# Patient Record
Sex: Male | Born: 1935 | Race: White | Hispanic: No | Marital: Married
Health system: Southern US, Community
[De-identification: ages and names within clinical notes are randomized; demographics above are authoritative.]

## PROBLEM LIST (undated history)

## (undated) DIAGNOSIS — C801 Malignant (primary) neoplasm, unspecified: Secondary | ICD-10-CM

## (undated) HISTORY — PX: HERNIA REPAIR: SHX51

## (undated) HISTORY — PX: EYE SURGERY: SHX253

---

## 2005-03-24 ENCOUNTER — Emergency Department: Payer: Self-pay | Admitting: Emergency Medicine

## 2005-03-24 ENCOUNTER — Other Ambulatory Visit: Payer: Self-pay

## 2006-02-13 ENCOUNTER — Ambulatory Visit: Payer: Self-pay | Admitting: Psychiatry

## 2006-02-13 ENCOUNTER — Other Ambulatory Visit: Payer: Self-pay

## 2006-12-21 ENCOUNTER — Emergency Department: Payer: Self-pay

## 2012-03-26 ENCOUNTER — Ambulatory Visit: Payer: Self-pay | Admitting: Urology

## 2012-04-04 ENCOUNTER — Ambulatory Visit: Payer: Self-pay | Admitting: Urology

## 2012-04-08 ENCOUNTER — Ambulatory Visit: Payer: Self-pay | Admitting: Urology

## 2012-05-30 ENCOUNTER — Ambulatory Visit: Payer: Self-pay | Admitting: Unknown Physician Specialty

## 2012-05-30 LAB — APTT: Activated PTT: 29.8 secs (ref 23.6–35.9)

## 2012-08-20 ENCOUNTER — Ambulatory Visit: Payer: Self-pay | Admitting: Unknown Physician Specialty

## 2012-08-22 ENCOUNTER — Ambulatory Visit: Payer: Self-pay | Admitting: Oncology

## 2012-08-22 LAB — CBC CANCER CENTER
Eosinophil #: 0.1 x10 3/mm (ref 0.0–0.7)
Eosinophil %: 2.5 %
HGB: 11.7 g/dL — ABNORMAL LOW (ref 13.0–18.0)
Lymphocyte #: 0.7 x10 3/mm — ABNORMAL LOW (ref 1.0–3.6)
Lymphocyte %: 13.3 %
MCH: 28.7 pg (ref 26.0–34.0)
MCV: 87 fL (ref 80–100)
Monocyte #: 0.5 x10 3/mm (ref 0.2–1.0)
Monocyte %: 9.8 %
RDW: 15.3 % — ABNORMAL HIGH (ref 11.5–14.5)
WBC: 5.4 x10 3/mm (ref 3.8–10.6)

## 2012-08-22 LAB — COMPREHENSIVE METABOLIC PANEL
Albumin: 2.7 g/dL — ABNORMAL LOW (ref 3.4–5.0)
Alkaline Phosphatase: 77 U/L (ref 50–136)
Anion Gap: 6 — ABNORMAL LOW (ref 7–16)
BUN: 18 mg/dL (ref 7–18)
Chloride: 100 mmol/L (ref 98–107)
Co2: 30 mmol/L (ref 21–32)
EGFR (African American): >60
EGFR (Non-African Amer.): >60
Osmolality: 274 (ref 275–301)
Potassium: 3.8 mmol/L (ref 3.5–5.1)
SGPT (ALT): 13 U/L (ref 12–78)
Sodium: 136 mmol/L (ref 136–145)
Total Protein: 7.3 g/dL (ref 6.4–8.2)

## 2012-08-23 LAB — CANCER ANTIGEN 19-9: CA 19-9: 10 U/mL (ref 0–35)

## 2012-08-28 LAB — CBC CANCER CENTER
Basophil %: 1 %
Eosinophil #: 0.1 x10 3/mm (ref 0.0–0.7)
Eosinophil %: 2.5 %
HCT: 36.1 % — ABNORMAL LOW (ref 40.0–52.0)
HGB: 11.8 g/dL — ABNORMAL LOW (ref 13.0–18.0)
Lymphocyte #: 0.8 x10 3/mm — ABNORMAL LOW (ref 1.0–3.6)
MCH: 28.3 pg (ref 26.0–34.0)
MCHC: 32.7 g/dL (ref 32.0–36.0)
MCV: 87 fL (ref 80–100)
Monocyte #: 0.5 x10 3/mm (ref 0.2–1.0)
Monocyte %: 9.5 %
Platelet: 285 x10 3/mm (ref 150–440)
RBC: 4.17 10*6/uL — ABNORMAL LOW (ref 4.40–5.90)
RDW: 15.9 % — ABNORMAL HIGH (ref 11.5–14.5)

## 2012-08-28 LAB — APTT: Activated PTT: 28.1 secs (ref 23.6–35.9)

## 2012-09-11 ENCOUNTER — Encounter (HOSPITAL_COMMUNITY): Payer: Self-pay | Admitting: Pharmacy Technician

## 2012-09-11 ENCOUNTER — Other Ambulatory Visit: Payer: Self-pay

## 2012-09-11 ENCOUNTER — Telehealth: Payer: Self-pay

## 2012-09-11 ENCOUNTER — Encounter (HOSPITAL_COMMUNITY): Payer: Self-pay | Admitting: *Deleted

## 2012-09-11 DIAGNOSIS — R1900 Intra-abdominal and pelvic swelling, mass and lump, unspecified site: Secondary | ICD-10-CM

## 2012-09-11 NOTE — Telephone Encounter (Signed)
Pt has been notified and instructed instructions have also been mailed to the home meds were also reviewed

## 2012-09-16 ENCOUNTER — Inpatient Hospital Stay: Payer: Self-pay | Admitting: Student

## 2012-09-16 LAB — CBC
HCT: 34.2 % — ABNORMAL LOW (ref 40.0–52.0)
HGB: 11.4 g/dL — ABNORMAL LOW (ref 13.0–18.0)
MCH: 29 pg (ref 26.0–34.0)
MCHC: 33.2 g/dL (ref 32.0–36.0)
MCV: 87 fL (ref 80–100)
Platelet: 313 10*3/uL (ref 150–440)

## 2012-09-16 LAB — COMPREHENSIVE METABOLIC PANEL
Albumin: 2.7 g/dL — ABNORMAL LOW (ref 3.4–5.0)
Alkaline Phosphatase: 83 U/L (ref 50–136)
BUN: 48 mg/dL — ABNORMAL HIGH (ref 7–18)
Chloride: 102 mmol/L (ref 98–107)
Co2: 27 mmol/L (ref 21–32)
Creatinine: 2.6 mg/dL — ABNORMAL HIGH (ref 0.60–1.30)
EGFR (African American): 24 — ABNORMAL LOW
EGFR (Non-African Amer.): 21 — ABNORMAL LOW
Glucose: 110 mg/dL — ABNORMAL HIGH (ref 65–99)
Osmolality: 287 (ref 275–301)
Potassium: 4.7 mmol/L (ref 3.5–5.1)
SGOT(AST): 31 U/L (ref 15–37)
Total Protein: 7.4 g/dL (ref 6.4–8.2)

## 2012-09-16 LAB — URINALYSIS, COMPLETE
Glucose,UR: NEGATIVE mg/dL (ref 0–75)
Hyaline Cast: 1
Ketone: NEGATIVE
Nitrite: NEGATIVE
Ph: 6 (ref 4.5–8.0)
Protein: 75
RBC,UR: 376 /HPF (ref 0–5)
Specific Gravity: 1.025 (ref 1.003–1.030)

## 2012-09-16 LAB — CK TOTAL AND CKMB (NOT AT ARMC)
CK, Total: 84 U/L (ref 35–232)
CK-MB: <0.5 ng/mL — ABNORMAL LOW (ref 0.5–3.6)

## 2012-09-16 LAB — TROPONIN I: Troponin-I: <0.02 ng/mL

## 2012-09-17 LAB — CBC WITH DIFFERENTIAL/PLATELET
Basophil #: 0 10*3/uL (ref 0.0–0.1)
Basophil %: 0.3 %
Eosinophil #: 0.1 10*3/uL (ref 0.0–0.7)
Lymphocyte %: 5.1 %
MCH: 29.1 pg (ref 26.0–34.0)
MCHC: 33.9 g/dL (ref 32.0–36.0)
Monocyte #: 0.9 x10 3/mm (ref 0.2–1.0)
Neutrophil #: 10.3 10*3/uL — ABNORMAL HIGH (ref 1.4–6.5)
Neutrophil %: 86.4 %
WBC: 12 10*3/uL — ABNORMAL HIGH (ref 3.8–10.6)

## 2012-09-17 LAB — BASIC METABOLIC PANEL
Anion Gap: 7 (ref 7–16)
BUN: 50 mg/dL — ABNORMAL HIGH (ref 7–18)
Calcium, Total: 8.8 mg/dL (ref 8.5–10.1)
Co2: 27 mmol/L (ref 21–32)
Creatinine: 2.33 mg/dL — ABNORMAL HIGH (ref 0.60–1.30)
EGFR (Non-African Amer.): 24 — ABNORMAL LOW
Glucose: 104 mg/dL — ABNORMAL HIGH (ref 65–99)
Osmolality: 287 (ref 275–301)
Sodium: 137 mmol/L (ref 136–145)

## 2012-09-18 LAB — BASIC METABOLIC PANEL
Anion Gap: 10 (ref 7–16)
BUN: 45 mg/dL — ABNORMAL HIGH (ref 7–18)
Calcium, Total: 8.5 mg/dL (ref 8.5–10.1)
Chloride: 105 mmol/L (ref 98–107)
EGFR (African American): 52 — ABNORMAL LOW
EGFR (Non-African Amer.): 45 — ABNORMAL LOW
Glucose: 134 mg/dL — ABNORMAL HIGH (ref 65–99)
Potassium: 3.8 mmol/L (ref 3.5–5.1)
Sodium: 139 mmol/L (ref 136–145)

## 2012-09-19 ENCOUNTER — Ambulatory Visit: Payer: Self-pay | Admitting: Oncology

## 2012-09-22 LAB — CULTURE, BLOOD (SINGLE)

## 2012-09-26 ENCOUNTER — Encounter (HOSPITAL_COMMUNITY): Payer: Self-pay | Admitting: *Deleted

## 2012-09-26 ENCOUNTER — Ambulatory Visit (HOSPITAL_COMMUNITY): Admission: RE | Admit: 2012-09-26 | Payer: Medicare Other | Source: Ambulatory Visit | Admitting: Gastroenterology

## 2012-09-26 ENCOUNTER — Ambulatory Visit: Payer: Self-pay | Admitting: Oncology

## 2012-09-26 ENCOUNTER — Telehealth: Payer: Self-pay | Admitting: Gastroenterology

## 2012-09-26 HISTORY — DX: Malignant (primary) neoplasm, unspecified: C80.1

## 2012-09-26 SURGERY — UPPER ENDOSCOPIC ULTRASOUND (EUS) LINEAR
Anesthesia: Monitor Anesthesia Care

## 2012-09-26 NOTE — Telephone Encounter (Signed)
Left message on Dr Aleda Grana nurse line (phone [phone]) that patient no showed EUS today. Advised that we are more than happy to reschedule this is they are interested. I have also asked that someone call back to confirm that they have received our message.

## 2012-09-26 NOTE — Telephone Encounter (Signed)
Can you please let Dr. Doylene Canning [facility]) know that Mr. Ginsberg did not show for todays EUS.  We can reschedule if they are still interested.

## 2012-10-05 ENCOUNTER — Emergency Department: Payer: Self-pay | Admitting: Emergency Medicine

## 2012-10-05 LAB — COMPREHENSIVE METABOLIC PANEL
Albumin: 2.4 g/dL — ABNORMAL LOW (ref 3.4–5.0)
Alkaline Phosphatase: 82 U/L (ref 50–136)
Anion Gap: 7 (ref 7–16)
Calcium, Total: 9 mg/dL (ref 8.5–10.1)
Chloride: 98 mmol/L (ref 98–107)
Co2: 33 mmol/L — ABNORMAL HIGH (ref 21–32)
Creatinine: 2.15 mg/dL — ABNORMAL HIGH (ref 0.60–1.30)
EGFR (African American): 31 — ABNORMAL LOW
EGFR (Non-African Amer.): 27 — ABNORMAL LOW
Glucose: 109 mg/dL — ABNORMAL HIGH (ref 65–99)
Osmolality: 295 (ref 275–301)
Potassium: 3.5 mmol/L (ref 3.5–5.1)
SGOT(AST): 50 U/L — ABNORMAL HIGH (ref 15–37)
SGPT (ALT): 16 U/L (ref 12–78)
Sodium: 138 mmol/L (ref 136–145)
Total Protein: 7.4 g/dL (ref 6.4–8.2)

## 2012-10-05 LAB — URINALYSIS, COMPLETE
Bacteria: NONE SEEN
Bilirubin,UR: NEGATIVE
Glucose,UR: NEGATIVE mg/dL (ref 0–75)
RBC,UR: 281 /HPF (ref 0–5)
Specific Gravity: 1.01 (ref 1.003–1.030)
WBC UR: 9 /HPF (ref 0–5)

## 2012-10-05 LAB — CBC
HGB: 10.7 g/dL — ABNORMAL LOW (ref 13.0–18.0)
RDW: 16.4 % — ABNORMAL HIGH (ref 11.5–14.5)
WBC: 9.5 10*3/uL (ref 3.8–10.6)

## 2012-10-20 ENCOUNTER — Ambulatory Visit: Payer: Self-pay | Admitting: Oncology

## 2012-11-19 DEATH — deceased

## 2013-05-07 IMAGING — CT CT ABD-PELV W/ CM
1 of 3 series · 12 of 32 positions shown, 17 images · non-contrast
Comparison: none

REASON FOR EXAM: Epigastric pain weight loss
COMMENTS:

PROCEDURE:     KCT - KCT ABDOMEN/PELVIS W  - May 30, 2012  [DATE]
RESULT:
TECHNIQUE: Helical 3 mm sections were obtained from the lung bases through
the pubic symphysis status post intravenous administration of 85 ml of
Bsovue-855 and oral contrast.

[Series 2: abd 3mm w 3.0 i40f 3 · axial · 0.66mm/px · z∈[-1130,-797]mm · 12 of 131 slices shown, 17 images]
[im 10/131  soft-tissue]
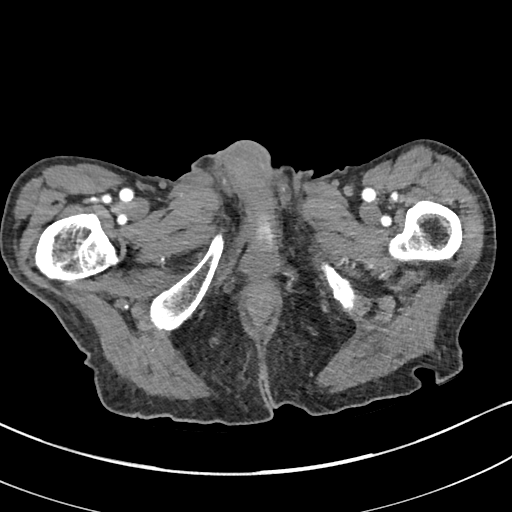
[im 10/131  bone]
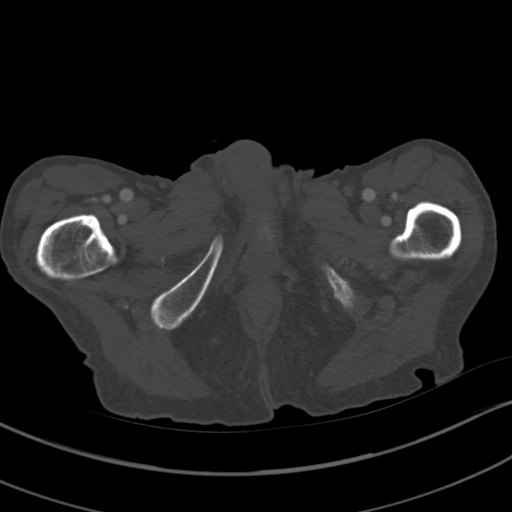
[im 19/131  soft-tissue]
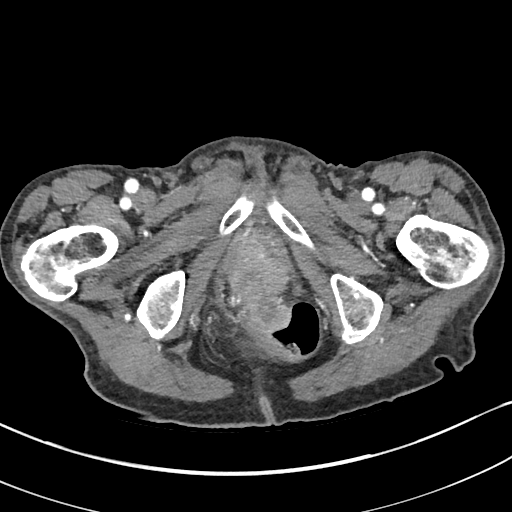
[im 28/131  soft-tissue]
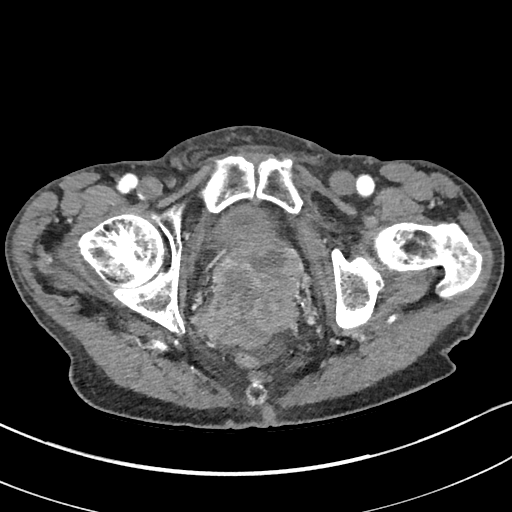
[im 47/131  soft-tissue]
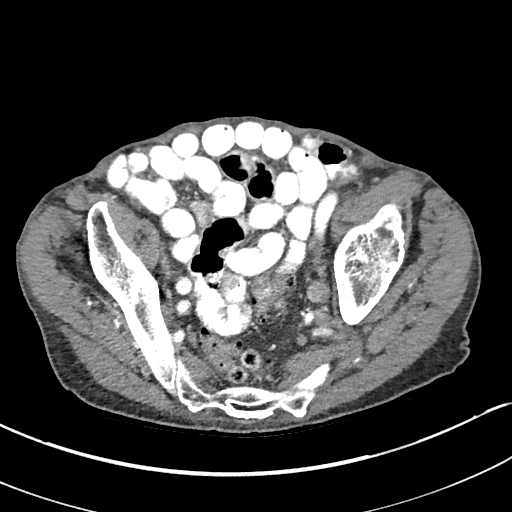
[im 56/131  soft-tissue]
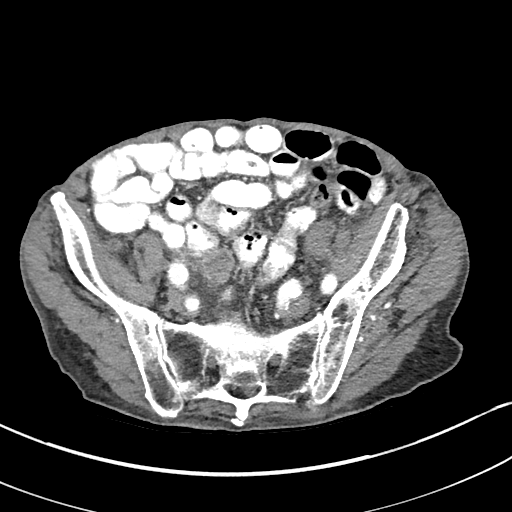
[im 66/131  soft-tissue]
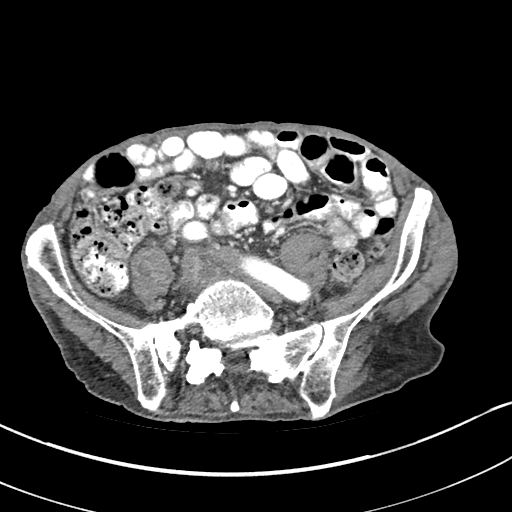
[im 75/131  soft-tissue]
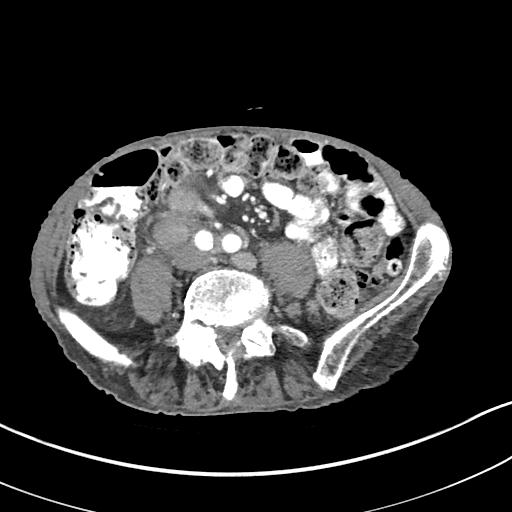
[im 84/131  soft-tissue]
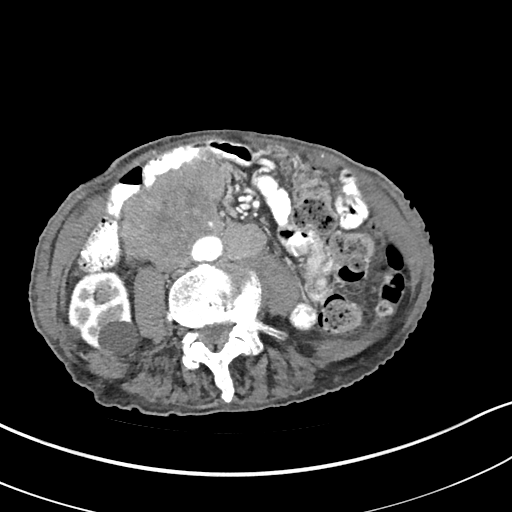
[im 93/131  lung]
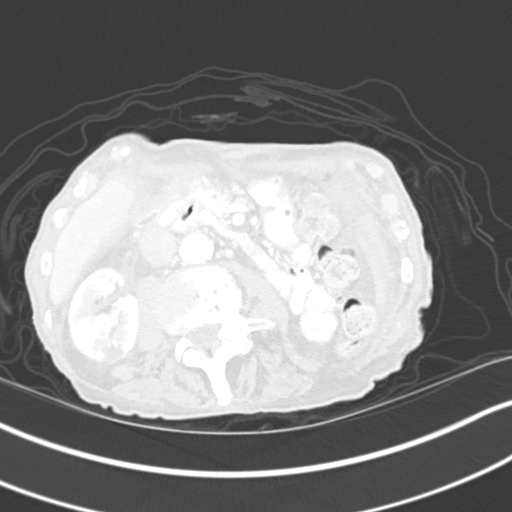
[im 103/131  soft-tissue]
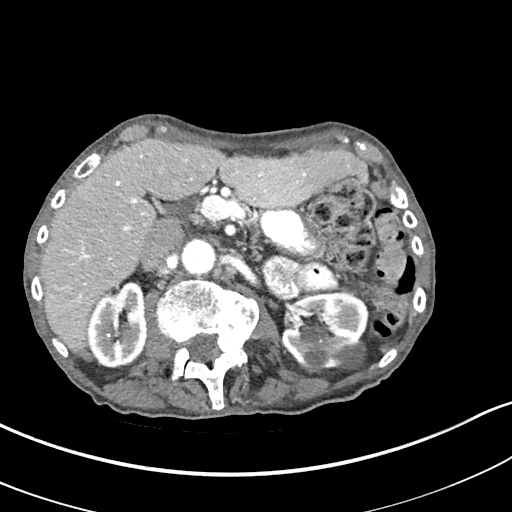
[im 103/131  lung]
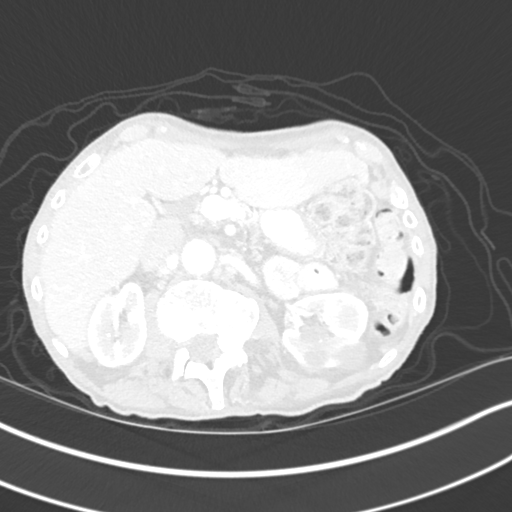
[im 103/131  bone]
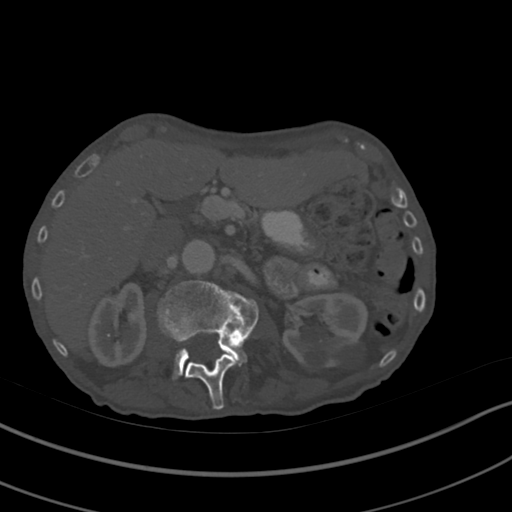
[im 112/131  soft-tissue]
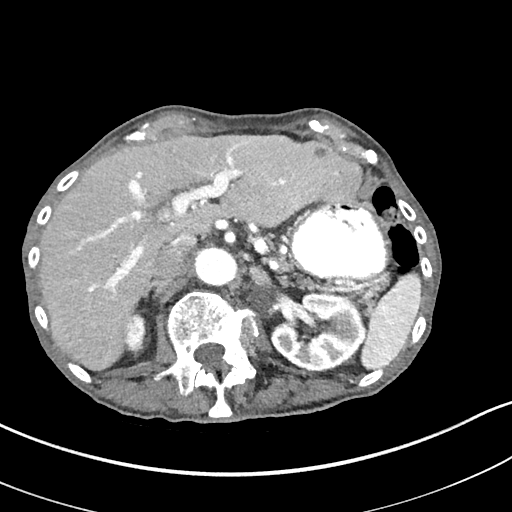
[im 112/131  lung]
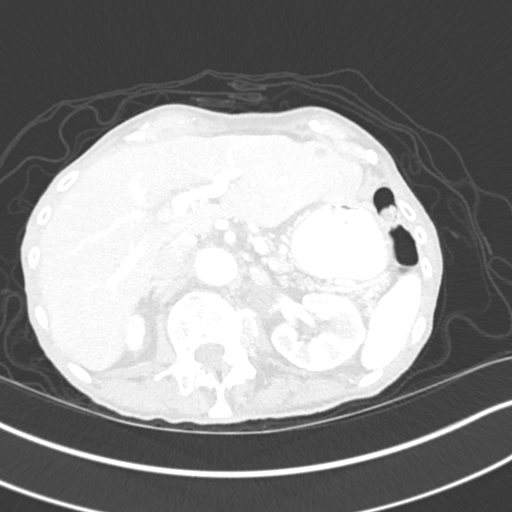
[im 121/131  soft-tissue]
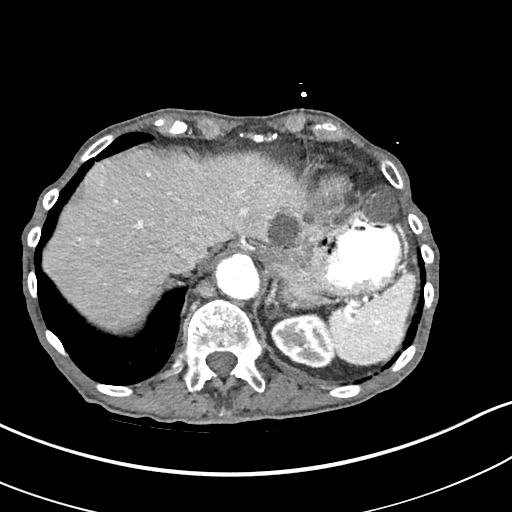
[im 121/131  lung]
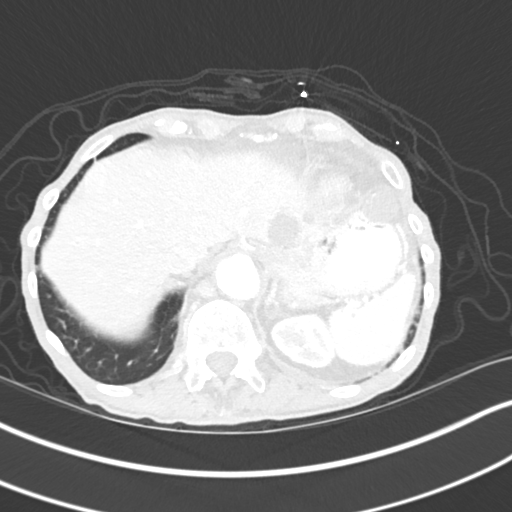

[12 of 32 positions shown; findings below may reference images not displayed]

FINDINGS: Within the base of the lingula a 1.63 cm, oval, partially
lobulated and mildly spiculated nodular density projects within the base of
the lingula anteriorly. The lung bases otherwise are grossly unremarkable.

Evaluation of the liver demonstrates low attenuating foci scattered
throughout the liver, nonenhancing. The largest is along the posterior
aspect of the left lobe of the lateral segment measuring 2.26 x 2.95 cm.
These areas likely represent cysts or possibly atypical hemangiomas. No
further definite liver masses are identified. The liver does demonstrate a
nodular peripheral border. The spleen and pancreas are unremarkable.
Evaluation of the kidneys demonstrates multiple, low attenuating foci within
the right and left kidneys. These areas likely represent cysts. A smaller,
primary low attenuating foci with a thickened rim is identified along the
posterior aspect of the midpole right kidney demonstrating Hounsfield units
of 27. This is an indeterminate finding.

Bulky adenopathy is appreciated within the retroperitoneal region with the
largest nodal mass long the lower portion of the abdomen on the right
measuring approximately 5.16 x 8.36 cm and extending into the lower abdomen
and pelvis. This mass encases the proximal aspect of the inferior mesenteric
artery.

Within the pelvis, a large fungating heterogeneously enhancing mass is
identified within the region of the prostate measuring 8.28 x 6.47 cm. This
mass causes indentation of the base of the bladder as well as findings
concerning for invasion of the base of the bladder. Pathologic sized
adenopathy is identified within the pelvis appreciated within the iliac
chain regions.

A moderate amount of fecal retention is identified within the colon.

There is no evidence of an abdominal aortic aneurysm.

Not mentioned above, there is thickening of the fundus of the stomach in the
region of the esophageal hiatus and extending into the cardiac portion. This
may be secondary to nondistention though considering the previously
described findings, infiltration cannot be excluded. The osseous structures
demonstrate diffuse degenerative changes within the spine and areas of the
pelvis. No evidence of blastic lesions identified or gross evidence of
fracture. No definite lytic foci are appreciated.
IMPRESSION: 1.  Extensive retroperitoneal adenopathy as described above. Not mentioned
in the above, there are multiple, small mesenteric lymph nodes as well as
findings which may represent a component of lymphangitic mesenteric
infiltration. Prominent mesenteric lymph nodes are identified.
2.  Large, fungating mass in which the epicenters appears to be within the
prostate consistent with prostate neoplasia until proven otherwise.
3.  Extensive pelvic adenopathy
4.  There are also findings concerning for/consistent with invasion of the
base of the bladder.
5.  Findings within the stomach as described above.

## 2013-09-12 IMAGING — CT CT HEAD WITHOUT CONTRAST
1 series · 15 of 30 positions shown, 19 images · non-contrast
Comparison: none

REASON FOR EXAM: FELL, STRUCK HEAD
COMMENTS:   May transport without cardiac monitor

[Series 2: soft tissue · axial · 0.42mm/px · z∈[-38,+118]mm · 15 of 35 slices shown, 19 images]
[im 2/35  brain]
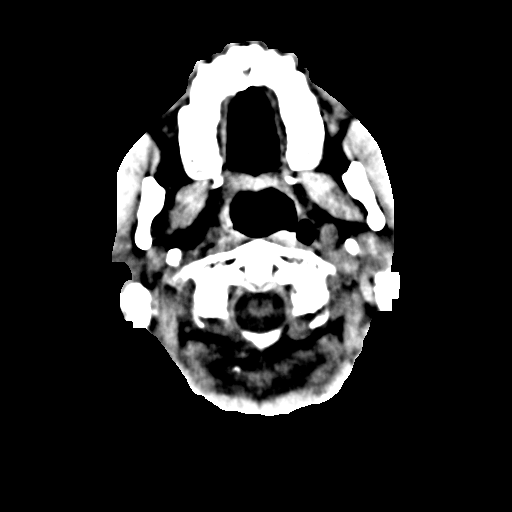
[im 2/35  bone]
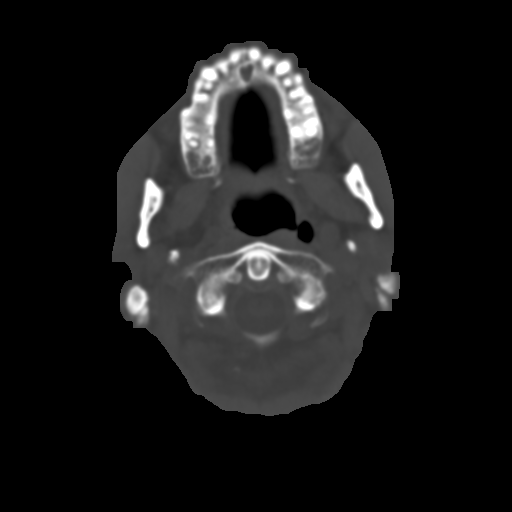
[im 4/35  brain]
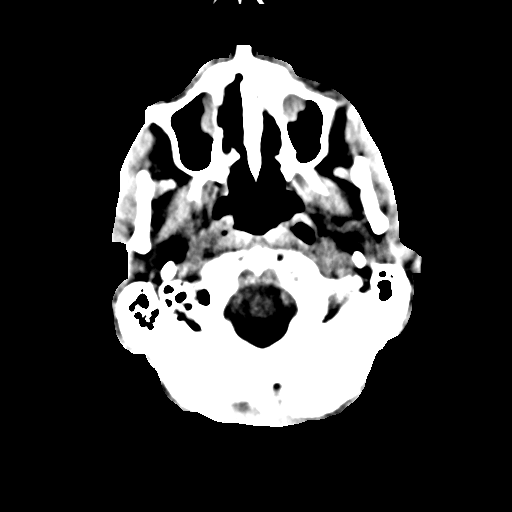
[im 6/35  brain]
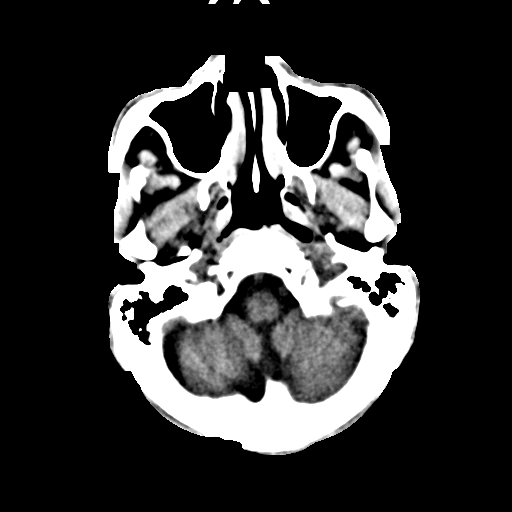
[im 9/35  brain]
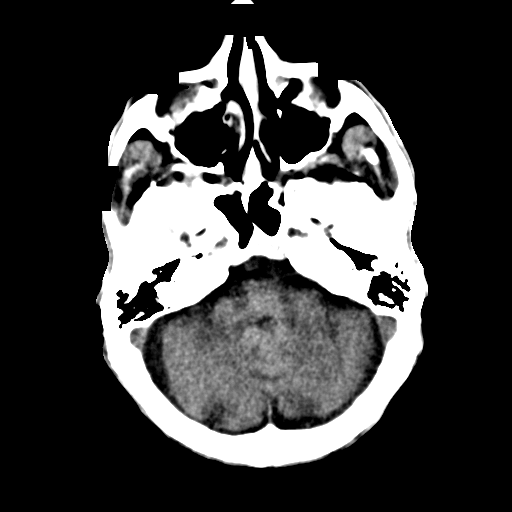
[im 11/35  brain]
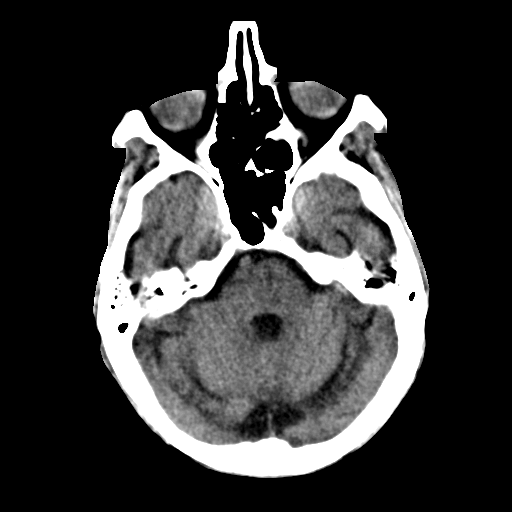
[im 11/35  bone]
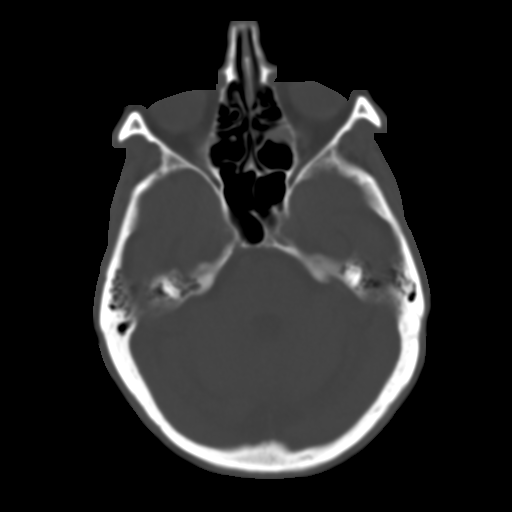
[im 13/35  brain]
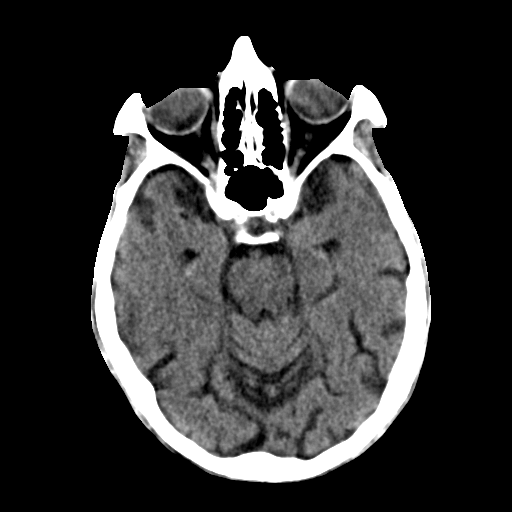
[im 16/35  brain]
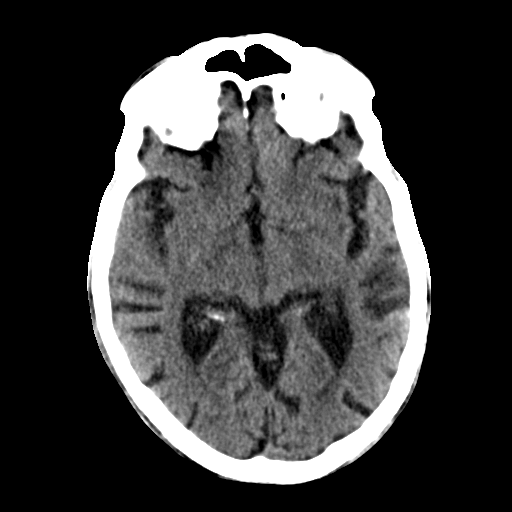
[im 18/35  brain]
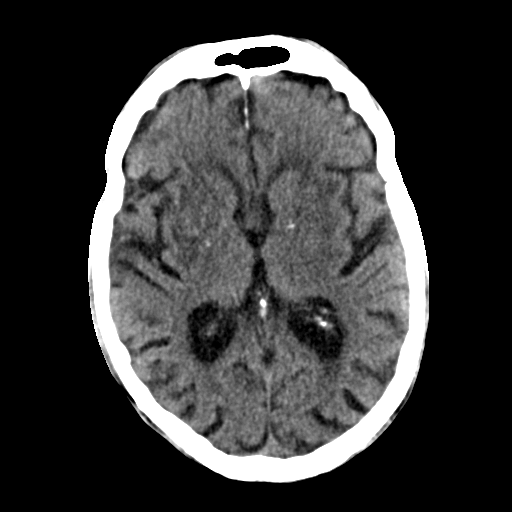
[im 19/35  brain]
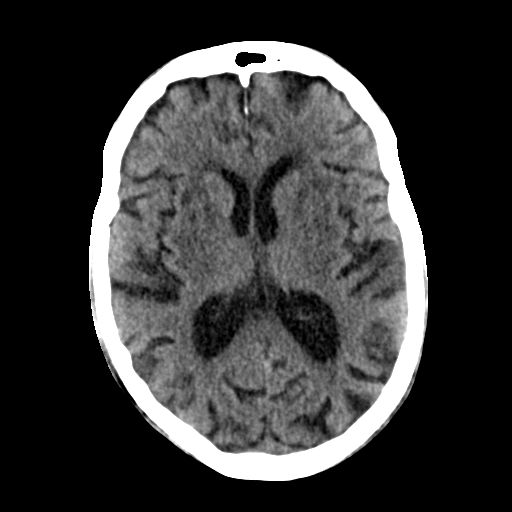
[im 19/35  bone]
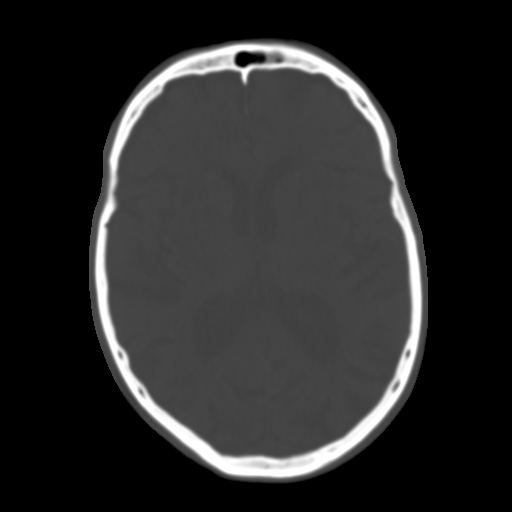
[im 22/35  brain]
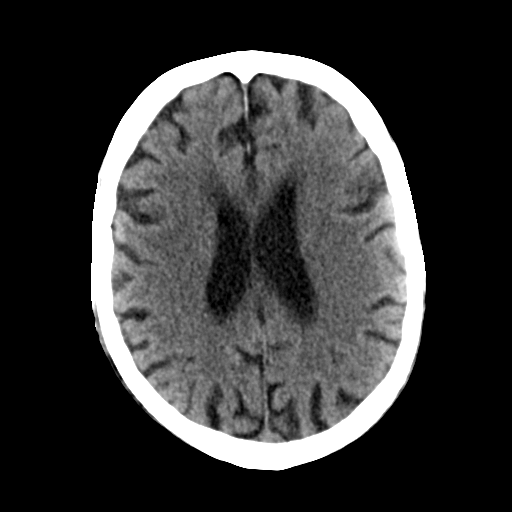
[im 24/35  brain]
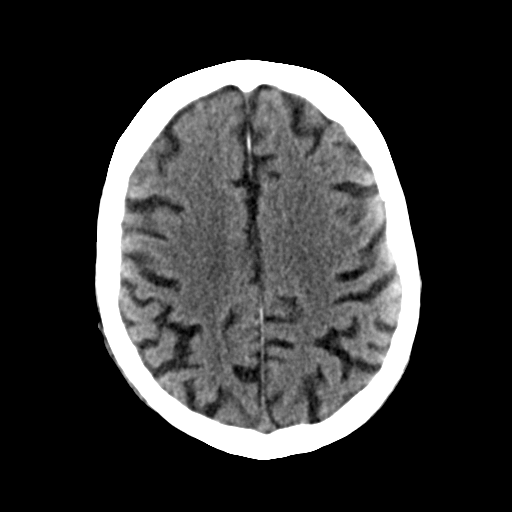
[im 26/35  brain]
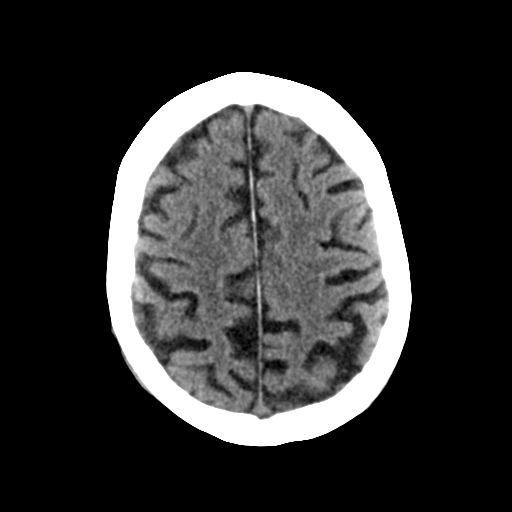
[im 29/35  brain]
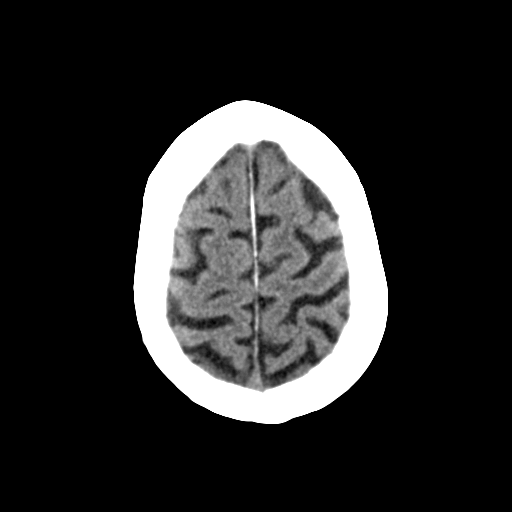
[im 29/35  bone]
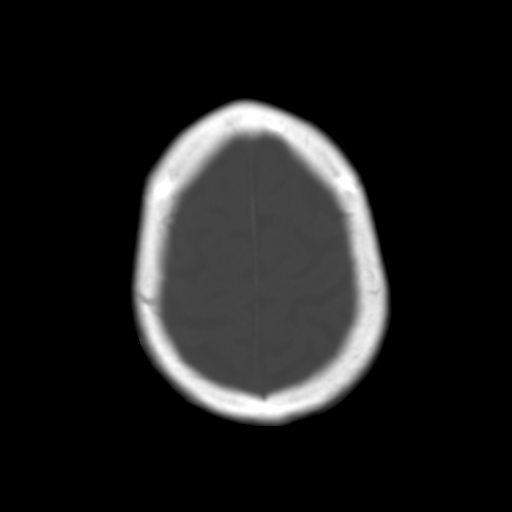
[im 31/35  brain]
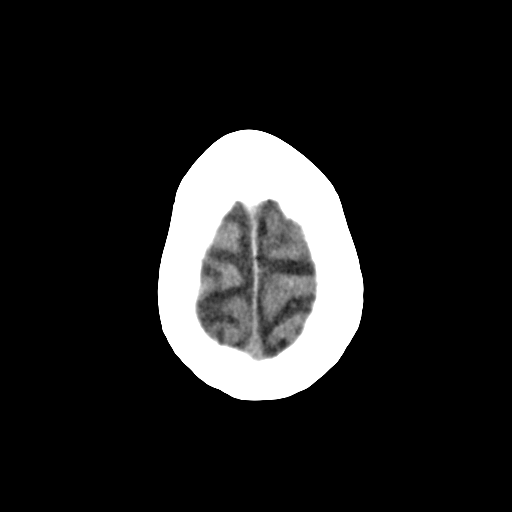
[im 33/35  brain]
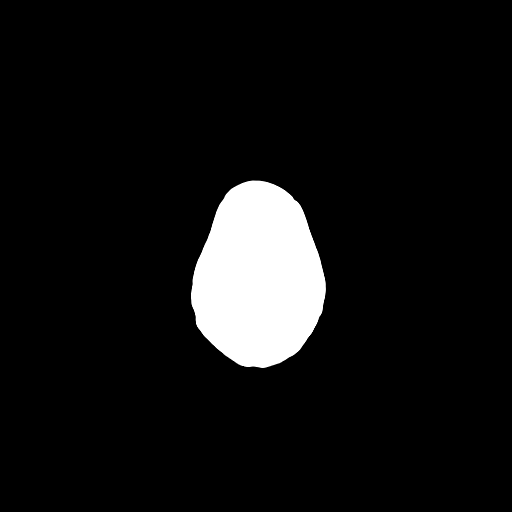

[15 of 30 positions shown; findings below may reference images not displayed]

PROCEDURE:     CT  - CT HEAD WITHOUT CONTRAST  - October 05, 2012  [DATE]

RESULT:     Axial noncontrast CT scanning was performed through the brain
with reconstructions at 5 mm intervals and slice thicknesses. Comparison is
made to the study September 16, 2012.

There is moderate diffuse cerebral and cerebellar atrophy with mild
compensatory ventriculomegaly. There is decreased density in the deep white
matter of both cerebral hemispheres consistent with chronic small vessel
ischemic type change. There are basal ganglia calcifications bilaterally.
There is no evidence of an acute intracranial hemorrhage nor of an evolving
ischemic infarction. The cerebellum and brainstem are normal in density.

At bone window settings there are small amounts of mucoperiosteal thickening
within the maxillary and right ethmoid sinus cells. There is no evidence of
an acute skull fracture.
IMPRESSION: 1. There is no evidence of an acute ischemic or hemorrhagic infarction. No
other intracranial hemorrhagic process is demonstrated.
2. There is no evidence of an acute skull fracture.
3. There are mild age related senescent changes.

A preliminary report was sent to the [HOSPITAL] the conclusion
of the study.

[REDACTED]

## 2014-09-08 NOTE — H&P (Signed)
PATIENT NAME:  Shawn Moran, WISLER MR#:  [id number] DATE OF BIRTH:  Sep 25, 1922  DATE OF ADMISSION:  04/08/2012  CHIEF COMPLAINT: Difficulty voiding.   HISTORY OF PRESENT ILLNESS: Mr. Mauss is an 81 year old white male who presented with microscopic hematuria and difficulty voiding. Current urinary symptoms include weak stream, straining, frequency and nocturia. He was also found to have a nodular prostate and elevated PSA. Subsequent biopsy indicated Gleason's grade 8 adenocarcinoma involving 11 out of 12 biopsies taken. Bone scan did not reveal any metastatic disease. The patient comes in now for photovaporization of the prostate with a green light laser. Ultrasound size of the prostate was estimated to be 81.9 grams. He has been cleared for this procedure per his primary care physician, Dr. Frazier Richards.   ALLERGIES: The patient is allergic to statins.   CURRENT MEDICATIONS: Probiotics, vitamin D3, coenzyme-Q, Resveratrol, Tylenol Extra Strength, and Pepto-Bismol. He was also started on Casodex 50 mg per day on November 7th.   PAST SURGICAL HISTORY: Previous surgical procedures include: 1. Bilateral inguinal herniorrhaphies. 2. Bilateral cataract repair. 3. Lithotripsy.  4. He also underwent microwave thermal therapy for benign prostatic hypertrophy in 2008.   SOCIAL HISTORY: He denied tobacco or alcohol use.   FAMILY HISTORY: Noncontributory.   PAST AND CURRENT MEDICAL CONDITIONS: Cardiomyopathy.   REVIEW OF SYSTEMS: The patient denies chest pain, shortness of breath, diabetes, stroke, or hypertension.     PHYSICAL EXAMINATION:  GENERAL: Elderly white male in no acute distress.   HEENT: Sclerae were clear. Pupils were equally round and reactive to light and accommodation. Extraocular movements were intact.   NECK: Supple. No palpable cervical adenopathy. No audible carotid bruits.   LUNGS: Clear to auscultation.   CARDIOVASCULAR: Regular rhythm and rate without audible  murmurs.   ABDOMEN: Soft, nontender abdomen.   GENITOURINARY: Circumcised. Testes smooth and nontender, 18 mL in size each.   RECTAL: Greater than 80 grams hard nodular prostate.   NEUROMUSCULAR: Nonfocal. He is alert and oriented x3.   IMPRESSION:  1. Bladder outlet obstruction.  2. Prostate cancer.   PLAN: Cystoscopy with photovaporization of prostate with the green light laser.   ____________________________ Otelia Limes. Yves Dill, MD [email or web address] D: 04/05/2012 09:27:42 ET T: 04/05/2012 10:07:47 ET JOB#: [id number]  cc: Otelia Limes. Yves Dill, MD, <Dictator> Royston Cowper MD ELECTRONICALLY SIGNED 04/08/2012 7:49

## 2014-09-08 NOTE — Op Note (Signed)
PATIENT NAME:  Shawn Moran, Shawn Moran MR#:  [id number] DATE OF BIRTH:  March 25, 1923  DATE OF PROCEDURE:  04/08/2012  PREOPERATIVE DIAGNOSES:  1. Bladder outlet obstruction.  2. Prostate cancer.   POSTOPERATIVE DIAGNOSES:  1. Bladder outlet obstruction.  2. Prostate cancer.   PROCEDURE PERFORMED: Photovaporization of the prostate with green light laser.   SURGEON: Otelia Limes. Yves Dill, M.D.   ANESTHETIST: Dr. Kayleen Memos  ANESTHESIA: General.   INDICATIONS: See the dictated history and physical. After informed consent, the patient requests the above procedure.   OPERATIVE SUMMARY: After adequate general anesthesia had been obtained, the patient was placed into the dorsal lithotomy position and the perineum was prepped and draped in the usual fashion. Laser scope was then coupled with the camera and then visually advanced into the bladder. The bladder was heavily trabeculated. Both ureteral orifices were identified and had clear efflux. The patient had trilobar benign prostatic hypertrophy with visual obstruction. At this point, the green light XPS laser fiber was introduced through the scope and set at a power setting of 80 watts. Vaporization of the bladder neck was then performed. Power was then increased to 120 watts and vaporization proceeded from the bladder neck to the verumontanum. Power was then increased to 180 watts and remaining obstructive tissue was vaporized. At this point, the       scope was removed. A 20 French silicone catheter was placed. The catheter was irrigated until clear. A B and O suppository was placed. The procedure was then terminated and the patient was transferred to the recovery room in stable condition.  ____________________________ Otelia Limes. Yves Dill, MD [email or web address] D: 04/08/2012 11:12:31 ET T: 04/08/2012 11:25:57 ET JOB#: [id number]  cc: Otelia Limes. Yves Dill, MD, <Dictator> Royston Cowper MD ELECTRONICALLY SIGNED 04/08/2012 12:51

## 2014-09-11 NOTE — Consult Note (Signed)
History of Present Illness:  Reason for Consult Retroperitoneal lymphadenopathy in a patient who has a previous history of carcinoma of prostate with high PSA According to patient he is on Lupron managed by urologist   HPI   81 year old gentleman with a previous history of followup carcinoma prostate now has multiple retroperitoneal adenopathy admitted in the hospital with poor appetite.  Feeling weak.  Declining performance status.  Increasing abdominal pain Patient had endoscopy to ultrasound-guided biopsy has been scheduledOn May 8.   PFSH:  Family History noncontributory   Social History negative alcohol, negative tobacco   Additional Past Medical and Surgical History Has been reviewed from previous multiple nodes   Review of Systems:  General weakness   Performance Status (ECOG) 2   HEENT no complaints   Lungs cough  SOB   Cardiac no complaints   GI nausea  poor appetite   GU h/o ca prostate   Musculoskeletal no complaints   Extremities swelling   Skin no complaints   Neuro no complaints   Endocrine no complaints   Psych anxiety  depression   NURSING NOTES: ED Vital Sign Flow Sheet:   28-Apr-14 18:05   Pulse Pulse: 87   Respirations Respirations: 18   SBP SBP: 139   DBP DBP: 78   Pulse Ox % Pulse Ox %: 97   Pulse Ox Source Source: Room Air  NURSING NOTES: **Vital Signs.:   29-Apr-14 13:56   Vital Signs Type: Routine   Temperature Temperature (F): 97.3   Celsius: 36.2   Temperature Source: oral   Pulse Pulse: 105   Respirations Respirations: 20   Systolic BP Systolic BP: 196   Diastolic BP (mmHg) Diastolic BP (mmHg): 69   Mean BP: 81   Pulse Ox % Pulse Ox %: 92   Pulse Ox Activity Level: At rest   Oxygen Delivery: Room Air/ 21 %   Physical Exam:  General Patient is alert oriented not in any acute distress.  Lying in the bed.  Cachectic.  Performance status is poor   Lungs: crepitations  rhonchi   Cardiac: regular rate,  rhythm   Abdomen: soft  nontender  positive bowel sounds   Extremities: edema  3+   Neuro: AAOx3  cranial nerves intact   Physical Exam No palpable lymphadenopathy     Cardiomyopathy:    CAD:    Prostate Cancer:    3 silent heart attacks:    rabdo:    Bilateral cataracts:    lithotripsy:    Greenlight laser prostate:    bilateral hernia surgeries:    Zocor: GI Distress  Toprol XL: Other  Niaspan ER: Other    acetaminophen-HYDROcodone 325 mg-5 mg oral tablet: 1 tab(s) orally 2 times a day, As Needed, Status: Active, Quantity: 60, Refills: None   Co Q-10 100 mg oral capsule: 1 cap(s) orally once a day, Status: Active, Quantity: 0, Refills: None   magnesium citrate: 1 tab(s) orally once a day, Status: Active, Quantity: 0, Refills: None  Laboratory Results: Routine Chem:  29-Apr-14 05:57   Glucose, Serum  104  BUN  50  Creatinine (comp)  2.33  Sodium, Serum 137  Potassium, Serum 4.2  Chloride, Serum 103  CO2, Serum 27  Calcium (Total), Serum 8.8  Anion Gap 7  Osmolality (calc) 287  eGFR (African American)  28  eGFR (Non-African American)  24 (eGFR values <32m/min/1.73 m2 may be an indication of chronic kidney disease (CKD). Calculated eGFR is useful in patients with stable renal  function. The eGFR calculation will not be reliable in acutely ill patients when serum creatinine is changing rapidly. It is not useful in  patients on dialysis. The eGFR calculation may not be applicable to patients at the low and high extremes of body sizes, pregnant women, and vegetarians.)  Routine Hem:  29-Apr-14 05:57   WBC (CBC)  12.0  RBC (CBC)  3.76  Hemoglobin (CBC)  11.0  Hematocrit (CBC)  32.4  Platelet Count (CBC) 298  MCV 86  MCH 29.1  MCHC 33.9  RDW  16.3  Neutrophil % 86.4  Lymphocyte % 5.1  Monocyte % 7.3  Eosinophil % 0.9  Basophil % 0.3  Neutrophil #  10.3  Lymphocyte #  0.6  Monocyte # 0.9  Eosinophil # 0.1  Basophil # 0.0 (Result(s) reported  on 17 Sep 2012 at 07:09AM.)   Radiology Results: [place]:    29-Apr-14 11:53, US Kidney Bilateral  US Kidney Bilateral   REASON FOR EXAM:    urinary retention, ARF  COMMENTS:       PROCEDURE: [place]  - US KIDNEY  - Sep 17 2012 11:53AM     RESULT: Grayscale and color flow Doppler techniques were employed to   evaluate both kidneys.    The echotexture of the renal cortex on the right is equal to or greater   than that of the adjacent liver. There is moderate hydronephrosis. The   right kidney measures 11.6 cm in length. There are cysts present in the   right kidney with the largest measuring approximately 2.8 cm in greatest   dimension.    Left kidney demonstrates increased echotexture similar to that on the     right. It measures 11.4 cm in length. There is no hydronephrosis. Cysts   are present on the left with the largest measuring 3.6 cm in greatest   dimension. There is a 1.1 cm diameter hyperechoic focus in the upper pole   left kidney consistent with a nonobstructing stone.    The urinary bladder was decompressed with a Foley catheter.    IMPRESSION:   1. Increased echotexture of the kidneys is consistent with medical renal   disease.  2. There is moderate hydronephrosis on the right.  3. Both kidneys exhibit multiple parapelvic and cortical cysts. There is   a nonobstructing calcified stone in the upper pole of the left kidney.  4. The urinary bladdercould not be assessed due to the presence of bowel   gas.   Dictation Site: 2        Verified By: DAVID A. Martinique, M.D., MD  LabUnknown:  PACS Image    Assessment and Plan: Impression:   abnormal CT scan of abdomen and pelvis  with   retroperitonreal  lymphadenopathy  gradually progressing.of  prostate cancer. on Luprondisease speededrenal insufficiencypatient on  prednisone 10 mgm  for symptomatic relief.on biopsy.needs to go to assisted  living facility.  Discussed that with patient's daughter with a gynecologist.will follow  this patient with you.  Possibility of ascites can be considered  Plan:   zytiga will be considered . prednisone   CC Referral:  cc: Dr. Tiffany Kocher, and Dr. Cathren Harsh   Electronic Signatures: Oliva Bustard, Martie Lee (MD)  (Signed 29-Apr-14 17:47)  Authored: HISTORY OF PRESENT ILLNESS, PFSH, ROS, NURSING NOTES, PE, PAST MEDICAL HISTORY, ALLERGIES, HOME MEDICATIONS, LABS, OTHER RESULTS, ASSESSMENT AND PLAN, CC Referring Physician   Last Updated: 29-Apr-14 17:47 by Jobe Gibbon (MD)

## 2014-09-11 NOTE — Discharge Summary (Signed)
PATIENT NAME:  Shawn Moran, Shawn Moran MR#:  [id number] DATE OF BIRTH:  Jan 04, 1923  DATE OF ADMISSION:  09/16/2012 DATE OF DISCHARGE:  09/19/2012   CONSULTANTS:  1.  Dr. Edrick Oh from urology. 2.  Dr. Oliva Bustard from oncology.   CHIEF COMPLAINT: Generalized weakness and difficulty walking.   DISCHARGE DIAGNOSES: 1.  Acute renal failure in the setting of urinary retention requiring a Foley.  2.  Hematuria, resolved.  3.  Prostate cancer, likely stage IV.  4.  Retroperitoneal lymphadenopathy.  5.  Pneumonia.  6.  Coronary artery disease.  7.  Severe malnutrition.  8.  History of prostate cancer.   DISCHARGE MEDICATIONS: Co-Q enzyme 100 mg daily, prednisone 10 mg daily, acetaminophen/hydrocodone 320-5/5 mg 1 tab every 8 hours as needed for pain, Tamsulosin 0.4 mg daily, megestrol 40 mg/mL, please take 10 milk 2 times a day, docusate/senna 50/8.6 mg 1 tablet once a day at bedtime, levofloxacin 250 mg daily for 4 days. The patient will be going with Foley to leg bag to rehab with dietary supplementation of Ensure 3 times a day.   ACTIVITY: As tolerated.   FOLLOWUP: Please follow with Dr. Oliva Bustard in one week. Please follow with Dr. Maryan Puls and PCP within 2 weeks.   CODE STATUS: The patient is full code.   LABORATORY AND DIAGNOSTIC DATA:  Initial BNP was 2040, BUN 48, creatinine 2.6. LFTs: Albumin on arrival was 2.7, otherwise within normal limits, last creatinine of 1.4 yesterday.  Troponin negative x 1. Initial WBC 12.4, last WBC of 12. Blood cultures on arrival: No growth to date. Urinalysis: 1+ leukocyte esterase, 21 WBCs, 376 RBCs.   CT of the head without contrast showing no definitive acute intracranial findings, subtle focus of hyperattenuation in the left cerebellum, chronic small vessel ischemic disease. CT of abdomen and pelvis without contrast showing progression of the retroperitoneal adenopathy, ascites, anasarca, mild bilateral hydronephrosis.  The bladder was nondistended.  There  was persistent, severe, irregular prostate enlargement and surrounding adenopathy. Ultrasound of the kidneys bilaterally showing increased echotexture of the kidneys and this is consistent with medical renal disease, moderate hydronephrosis on the right. Both kidneys exhibited multiple parapelvic and cortical cysts, nonobstructive calcified stone in the upper pole of the left kidney. A chest x-ray, PA and lateral, showing heterogeneous opacity in the left mid lung that may represent infection.  HISTORY OF PRESENT ILLNESS AND HOSPITAL COURSE:  For full details of history and physical, please see the dictation on 09/16/2012 by Dr. Verdell Carmine but briefly this is a pleasant 81 year old male with generalized weakness who came into the ER on the 28th with some falls in the previous few days. He was noted to have renal failure. He has had significant weight loss in the last 6 months it was admitted to the hospitalist service. He was started on some IV fluids as well as ceftriaxone and azithromycin for pneumonia. He does have history of prostate cancer which follows with Dr. Yves Dill. He was found with the abdominal mass and lymphadenopathy as seen in the CAT scan and as he was being followed by Dr. Oliva Bustard, he was consulted. The patient also has cachexia and severe malnutrition and was seen by dietary here. Although not dictated in the initial history and physical, the patient did have mild leukocytosis and tachycardia on arrival which per criteria, meets definition for SIRS and with the pneumonia, sepsis. He has been transitioned into Levaquin and he will be discharged on that for the pursuant pneumonia.   His  blood cultures have been negative and sepsis appears to have resolved with improved heart rate overall and decreased WBC. He does have a Foley. Acute renal failure on arrival was likely secondary to urinary retention with history of prostate cancer. He should follow with urology as an outpatient. He has been started on  prednisone and the plan is to start him on Zytiga as an outpatient by Dr. Oliva Bustard. Furthermore, outpatient biopsy scheduled for 09/26/2012 for the mass and the lymphadenopathy intra-abdominally. He has been started on Megace with Ensure for his cachexia and severe malnutrition.   He CODE STATUS: Full code.  Total time spent: 35 minutes.      ____________________________ Vivien Presto, MD sa:ct D: 09/19/2012 15:25:12 ET T: 09/19/2012 15:42:43 ET JOB#: [id number]  cc: Vivien Presto, MD, <Dictator> Dr. Georgia Dom R. Yves Dill, MD Martie Lee. Oliva Bustard, MD  Vivien Presto MD ELECTRONICALLY SIGNED 10/14/2012 15:08

## 2014-09-11 NOTE — Consult Note (Signed)
PATIENT NAME:  Shawn Moran, FRIEDEN MR#:  [id number] DATE OF BIRTH:  June 07, 1922  DATE OF CONSULTATION:  09/17/2012  CONSULTING PHYSICIAN:  Denice Bors. Jacqlyn Larsen, MD  HISTORY OF PRESENT ILLNESS:  The patient is an 81 year old gentleman with a history of prostate cancer. He has been under the care of Dr. Yves Dill. He underwent a transurethral resection of the prostate in the recent past by Dr. Yves Dill. He also has a history of prostate cancer. He has been on Lupron injections for prostate cancer control. He has also been under the care of the [facility] and Dr. Oliva Bustard. His most recent PSA was around 31. There has been progressive rise, consistent with recurrent metastatic prostate cancer. He presented to his primary care physician with profound weakness and difficulty with ambulation. He has also been experiencing lower abdominal discomfort for the past few weeks. He underwent a CT scan in the recent past demonstrating prominent lymphadenopathy in the abdomen and area of the pancreas. There were concerns over the possibility of lymphoma with his rising PSA. There is a high probability that this could also be metastatic prostate cancer. He has had progressive weight loss and general decline. He was found to have significant elevation of serum creatinine to 2.6 as well as left-sided pneumonia. He was admitted for further evaluation. He underwent a bladder scan demonstrating approximately 1 liter in his bladder. His most recent CT scan demonstrated no evidence of urinary retention. His renal function was within normal limits at that time. Based on his discussion and symptoms, he has probably had progressive urinary retention over the past few weeks. An attempt was made at Foley catheter placement by the nurse, which was unsuccessful. An attempt at Foley catheter placement by myself was also unsuccessful with some blood return. With his history of TURP, bladder neck contracture is the most probable cause. Urology consultation was  obtained for evaluation and also catheter placement.   PAST MEDICAL HISTORY: Significant for prostate cancer, bladder outlet obstruction.   PAST SURGICAL HISTORY: Significant for transurethral resection of the prostate.   SOCIAL HISTORY: Negative for tobacco, alcohol or drug use.   ALLERGIES: NIASPAN, TOPROL AND ZOCOR.   MEDICATIONS ON ADMISSION: Hydrocodone as needed, Coenzyme-Q, magnesium citrate 1 tablet daily.   PHYSICAL EXAMINATION: VITAL SIGNS: Vital signs stable.  HEENT: Within normal limits.  CHEST: Clear to auscultation bilaterally.  CARDIOVASCULAR: Regular rate and rhythm.  ABDOMEN: Soft with some distention in the suprapubic region, bladder palpable. No appreciable CVA tenderness. No other palpable mass.  GENITOURINARY: External genitalia within normal limits.  EXTEMITIES: Free range of motion x 4, 2+ edema is noted in the lower extremities.  NEUROLOGIC: Motor and sensory grossly intact.   ASSESSMENT: 1.  Bladder neck contracture.  2.  Urinary retention.  3.  Metastatic prostate cancer.  4.  Acute renal failure.   RECOMMENDATION: The patient underwent attempted catheter placement, which was unsuccessful. This was even utilizing a coude tip catheter. Cystoscopy was performed demonstrating no significant urethral abnormalities. The prostate demonstrated some lateral lobe tissue. There was a false passage at the level of the bladder neck. There was some elevation of the bladder neck consistent with bladder neck contracture. The opening into the bladder, however, was adequate. The scope was able to be advanced past the bladder neck into the urinary bladder. A guidewire was placed. A subsequent 20 [place] council tip catheter was inserted without further difficulty. Approximately 800 mL of urine was drained from the bladder. The current renal function status is  likely related to the chronic urinary retention. He has been on no alpha blockers. We will, therefore, start Flomax 0.4 mg  in preparation for voiding trial. He will need to keep the Foley catheter for at least 10 days to allow bladder decompression. The voiding trial can be performed on an outpatient basis under the direction of Dr. Yves Dill. He is scheduled for biopsy of lymph nodes/mass in the region of the pancreas in [place] early May. Given his history, this is likely metastatic prostate cancer over lymphoma, although lymphoma and other cancers must be ruled out. There are other treatment options available if this is indicative of metastatic prostate cancer. With his acute renal failure and the degree of lymphadenopathy, he is at significant risk for ureteral obstruction. We have therefore elected to proceed with a CT scan of the abdomen and pelvis without contrast for further evaluation. There should be decompression of the kidneys after catheter placement if the renal function is related to the urinary retention. If there is any persistent obstruction, stent placement or nephrostomy tube placement may need to be further considered. If he has prompt resolution of his renal function status, Foley catheterization is all that is indicated. I will continue to follow with you and make further recommendations as indicated. I will also make recommendations post CT. If scan there are any further questions, please feel free to contact us. Follow up as an outpatient should be under Dr. Yves Dill  his established urologist.   ____________________________ Denice Bors. Jacqlyn Larsen, MD bsc:cc D: 09/17/2012 17:17:07 ET T: 09/17/2012 18:02:18 ET JOB#: [id number]  cc: Denice Bors. Jacqlyn Larsen, MD, <Dictator> Denice Bors Sahara Fujimoto MD ELECTRONICALLY SIGNED 09/19/2012 8:05

## 2014-09-11 NOTE — Consult Note (Signed)
Patient seen, chart reviewed, films reviewed, note dictated. Urinary retention, metastatic prostate cancer, acute renal failure With his history of prostate cancer and PSA elevation, the abdominal lymphadenopathy is likely to be metastatic prostate cancer.  It is less likely to be lymphoma or other disease.  These however must be ruled out.  He is scheduled for biopsy in the near future.  His renal function was excellent in the recent past through the cancer center.  The current level is more consistent with urinary retention and obstruction.  Based on his symptoms, this is likely been ongoing for at least several weeks with gradual worsening.  He has had a previous TURP.  There is slight bladder neck contracture present.  The Foley catheter was Catching at the bladder neck.  The opening however is more than adequate for a 16-French Foley catheter placement.  Catheter placement was performed under direct visualization with guidewire placement into the urinary bladder with subsequent Council catheter placement over the guidewire.  Approximately 800 mL of dark urine was returned.  He's had substantial improvement in his suprapubic and lower abdominal discomfort with catheter placement.  There will likely be prompt improvement in his renal function status.  Given his degree of lymphadenopathy, there is also significant potential for ureteral obstruction.  This was not demonstrated on his most recent CT scan.  With the current changes and significant risk, a repeat CT scan has been arranged without contrast.  With the catheter placement, there should be decompression of his upper tracts.  If there is persistent hydronephrosis with no significant improvement in renal function, stent placement or nephrostomy tube placement of a need to be further considered.  I will review the CT scan and make further recommendations as indicated.  We will also start Flomax 0.4 mg daily.  This will likely help with bladder emptying.  He  will need to keep the Foley catheter for at least 10 days to allow bladder decompression.  He will then need a voiding trial on an outpatient basis.  He is under the care of Dr. Rogers Blocker.  Follow-up with Dr. Rogers Blocker for the voiding trial is recommended.  I will follow with you during the hospitalization and make further recommendations as indicated.  If there are any further questions, please feel free to contact us.  Electronic Signatures: Murrell Redden (MD)  (Signed on 29-Apr-14 16:45)  Authored  Last Updated: 29-Apr-14 16:45 by Murrell Redden (MD)

## 2014-09-11 NOTE — H&P (Signed)
PATIENT NAME:  Shawn Moran, Shawn Moran MR#:  [id number] DATE OF BIRTH:  1923-03-04  DATE OF ADMISSION:  09/16/2012  PRIMARY CARE PHYSICIAN: Dr. Frazier Richards   CHIEF COMPLAINT: Generalized weakness and difficulty walking.   HISTORY OF PRESENT ILLNESS: This is an 81 year old male sent over from his primary care physician's office to the ER due to profound weakness and difficulty walking. The patient also says that he has been having significant falls over the past few days. He also complains that he has been having some significant abdominal pain for the past few months. He is currently being worked up for possible malignancies in the abdomen suspected to be lymphoma. The patient said he has had about a 30-pound weight loss over the past 6 months. Also, he has had poor p.o. intake over the past few weeks or so, too. The patient presented to the ER and was noted to be in acute renal failure with a creatinine of 2.6, also noted to have a left-sided pneumonia. Hospitalist services were contacted for further treatment and evaluation.   REVIEW OF SYSTEMS:   CONSTITUTIONAL: No documented fever. Positive generalized weakness. Positive 30-pound weight loss, no weight gain.  EYES: No blurred or double vision.  ENT: No tinnitus. No postnasal drip. No redness of the oropharynx.  RESPIRATORY: No cough, no wheeze, no hemoptysis, no dyspnea.  CARDIOVASCULAR: No chest pain, no orthopnea, no palpitations, no syncope.  GASTROINTESTINAL: Positive nausea. No vomiting. Positive generalized abdominal pain.  No melena or hematochezia.  GENITOURINARY: No dysuria, no hematuria.  ENDOCRINE: No polyuria or nocturia. No heat or cold intolerance.  HEMATOLOGIC: No anemia. No bruising, no bleeding.  INTEGUMENTARY: No rashes, no lesions.  MUSCULOSKELETAL: No arthritis, no swelling, no gout.  NEUROLOGIC: No numbness or tingling. No ataxia. No seizure-type activity.  PSYCHIATRIC: No anxiety. No insomnia. No ADD.   PAST MEDICAL  HISTORY: Consistent with history of prostate cancer, status post Lupron injections.  ALLERGIES: NIASPAN, TOPROL AND ZOCOR.   SOCIAL HISTORY: No smoking. No alcohol abuse. No illicit drug abuse. He lives at home with his wife.   FAMILY HISTORY: The patient's father died from complications of a blood clot. Mother died at age 76 from an MI.   CURRENT MEDICATIONS: The patient takes Tylenol with hydrocodone, which is generic Vicodin, 5/325, 1 tab b.i.d. as needed, Coenzyme Q 1 tab daily, magnesium citrate 1 tab daily.   PHYSICAL EXAMINATION:  VITAL SIGNS: On admission, temperature is 97.9, pulse 87, respirations 18, blood pressure 139/78 chest and sats 97% on room air.  GENERAL: The patient is a very cachectic, emaciated-appearing male, generally weak but in no apparent distress.  HEENT: He is atraumatic, normocephalic. Extraocular muscles are intact. Pupils are equal and reactive to light. Sclerae are anicteric. No oropharyngeal erythema.  NECK: Supple. There is no jugular venous distention. No bruits, no lymphadenopathy, no thyromegaly.  HEART: Regular rate and rhythm. No murmurs, no rubs or clicks.  LUNGS: Clear to auscultation bilaterally. No rales, no rhonchi, no wheezes.  ABDOMEN: Soft, flat, tender diffusely but no rebound, no rigidity. Hypoactive bowel sounds. No hepatosplenomegaly appreciated.  EXTREMITIES: No evidence of any cyanosis, clubbing; +2 pitting edema from the knees to the ankles bilaterally.  NEUROLOGICAL: The patient is alert, awake, and oriented x3 with no focal motor or sensory deficits appreciated bilaterally.  SKIN: Moist and warm with no rash appreciated.  LYMPHATIC: There is no cervical or axillary lymphadenopathy.   LABORATORY AND RADIOLOGICAL DATA:  Serum glucose of 110, BUN 48, creatinine  2.6, sodium 137, potassium 4.7, chloride 102, bicarbonate 27, albumin is 2.7. Troponin less than 0.02. White cell count 1.4, hemoglobin 11.4, hematocrit 34.2, platelet count of 313.    The patient did have a CT of the head done which showed no evidence of any acute intracranial abnormalities. The patient also had a chest x-ray done which showed a heterogeneous opacity in the left mid lung, may represent infection.  ASSESSMENT AND PLAN: This is an 81 year old male with a history of prostate cancer who is currently being worked up for intra-abdominal mass with lymphadenopathy suspected to be lymphoma, presents to the hospital due to generalized weakness, falls and noted to be in acute renal failure and also noted to have a pneumonia.   1.  Acute renal failure: Likely this is secondary to dehydration and poor p.o. intake. The patient has had severe nausea and severe cachexia and weight loss over the past few months. This is  likely ATN and prerenal azotemia from the dehydration. I will hydrate the patient with IV fluids, follow BUN and creatinine and urine output. If needed, consider a nephrology consult and a renal ultrasound.  2.  Pneumonia: This is likely community-acquired pneumonia, although the patient clinically has no symptoms of shortness of breath, cough or congestion. The patient is afebrile, has a mildly elevated white cell count. I will go ahead and empirically treat the patient with ceftriaxone and Zithromax and follow sputum and blood cultures.  3.  History of prostate cancer: The patient follows up with Dr. Yves Dill. He is being treated with Lupron shots.  4.  Intra-abdominal mass with lymphadenopathy: The patient follows with Dr. Oliva Bustard. They are suspecting a lymphoma. His recent tumor markers have been negative except for his prostate specific antigen which has been significantly high.  5.  Cachexia with severe malnutrition: This is secondary to possible underlying malignancy and poor p.o. intake. We will place the patient on some p.o. Megace and also put him on some Ensure supplements.  6.  Urinary tract infection: The patient is being given antibiotics for the pneumonia  which should also treat the UTI.   CODE STATUS: The patient is a FULL CODE.   He will be transferred to Dr. Alphonzo Lemmings service.   TIME SPENT WITH ADMISSION:  50 minutes.    ____________________________ Belia Heman. Verdell Carmine, MD vjs:cb D: 09/16/2012 20:01:31 ET T: 09/16/2012 20:27:12 ET JOB#: [id number]  cc: Belia Heman. Verdell Carmine, MD, <Dictator> Henreitta Leber MD ELECTRONICALLY SIGNED 09/17/2012 19:39

## 2014-09-11 NOTE — Consult Note (Signed)
CT reviewed.  Mild bilateral hydronephrosis as expected with the urinary retention.evidence the adenopathy was causing the obstruction.down to 1.4. Expect continued gradual improvment.dark, no active bleeding.size of the lymph node mass in the last month.with biopsy as scheduled.should follow up with Dr. Eliberto Ivory, his primary urologist.  Electronic Signatures: Murrell Redden (MD)  (Signed on 01-May-14 07:53)  Authored  Last Updated: 01-May-14 07:53 by Murrell Redden (MD)
